# Patient Record
Sex: Female | Born: 1985 | Race: Black or African American | Hispanic: No | Marital: Single | Smoking: Never smoker
Health system: Southern US, Community
[De-identification: ages and names within clinical notes are randomized; demographics above are authoritative.]

## PROBLEM LIST (undated history)

## (undated) DIAGNOSIS — N6019 Diffuse cystic mastopathy of unspecified breast: Secondary | ICD-10-CM

## (undated) DIAGNOSIS — D249 Benign neoplasm of unspecified breast: Secondary | ICD-10-CM

## (undated) HISTORY — DX: Benign neoplasm of unspecified breast: D24.9

## (undated) HISTORY — DX: Diffuse cystic mastopathy of unspecified breast: N60.19

---

## 2010-05-17 ENCOUNTER — Encounter: Payer: Self-pay | Admitting: Family Medicine

## 2010-05-25 ENCOUNTER — Encounter: Payer: Self-pay | Admitting: Family Medicine

## 2010-06-28 ENCOUNTER — Ambulatory Visit: Payer: Self-pay | Admitting: Family Medicine

## 2011-07-06 ENCOUNTER — Emergency Department: Payer: Self-pay | Admitting: Emergency Medicine

## 2011-09-25 HISTORY — PX: BREAST BIOPSY: SHX20

## 2012-03-03 ENCOUNTER — Ambulatory Visit: Payer: Self-pay | Admitting: General Surgery

## 2012-03-03 LAB — PREGNANCY, URINE: Pregnancy Test, Urine: NEGATIVE m[IU]/mL

## 2012-03-04 LAB — PATHOLOGY REPORT

## 2012-04-25 LAB — HEMOGLOBIN A1C: Hgb A1c MFr Bld: 5.7 % (ref 4.0–6.0)

## 2013-03-30 ENCOUNTER — Ambulatory Visit: Payer: Self-pay | Admitting: General Surgery

## 2013-05-07 ENCOUNTER — Encounter: Payer: Self-pay | Admitting: *Deleted

## 2014-08-25 LAB — LIPID PANEL
Cholesterol: 197 mg/dL (ref 0–200)
HDL: 56 mg/dL (ref 35–70)
LDL Cholesterol: 125 mg/dL
Triglycerides: 82 mg/dL (ref 40–160)

## 2014-08-25 LAB — HM PAP SMEAR: HM PAP: NORMAL

## 2015-01-16 NOTE — Op Note (Signed)
PATIENT NAME:  Kristin Allison, Kristin Allison MR#:  [id number] DATE OF BIRTH:  08/02/1986  DATE OF PROCEDURE:  03/03/2012  PREOPERATIVE DIAGNOSIS: Fibroadenoma right breast.   POSTOPERATIVE DIAGNOSIS: Fibroadenoma right breast.   PROCEDURE: Excision right breast mass.   SURGEON: S.G. Jamal Collin, MD  ANESTHESIA: Local with a mixture of 0.5% Marcaine, 1% Xylocaine.   COMPLICATIONS: None.   ESTIMATED BLOOD LOSS: Minimal.   DRAINS: None.   DESCRIPTION OF PROCEDURE: Patient was placed in the supine position on the operating table. After adequate IV sedation and monitoring, the right breast was prepped and draped out as a sterile field. At the 7:00 position in the right breast there was a palpable 2 cm mass. This had been previously core biopsied showing fibroadenoma with some atypical features for which excision was recommended. The skin was infiltrated with local anesthetic and a curved incision was made overlying this area, deepened through to the glandular tissue and a somewhat circumscribed mildly lobulated 2 cm mass was noted. This was excised out along with some normal breast tissue surrounding. After bleeding controlled with cautery, the deeper tissues were closed with 2-0 Vicryl and the skin closed with subcuticular 4-0 Vicryl, covered with Dermabond. Procedure was well tolerated. The excised specimen was sent in formalin for pathologic evaluation. Patient subsequently returned to recovery room in stable condition.   ____________________________ S.Robinette Haines, MD sgs:cms D: 03/03/2012 16:56:33 ET T: 03/04/2012 09:17:21 ET  JOB#: [id number] cc: S.G. Jamal Collin, MD, <Dictator> [facility] Robinette Haines MD ELECTRONICALLY SIGNED 03/04/2012 17:22

## 2015-02-04 ENCOUNTER — Other Ambulatory Visit (HOSPITAL_COMMUNITY): Payer: Self-pay | Admitting: Family Medicine

## 2015-02-04 ENCOUNTER — Other Ambulatory Visit: Payer: Self-pay | Admitting: Family Medicine

## 2015-02-04 ENCOUNTER — Other Ambulatory Visit: Payer: Self-pay

## 2015-02-04 ENCOUNTER — Ambulatory Visit (HOSPITAL_COMMUNITY): Payer: BLUE CROSS/BLUE SHIELD | Attending: Internal Medicine

## 2015-02-04 DIAGNOSIS — I071 Rheumatic tricuspid insufficiency: Secondary | ICD-10-CM | POA: Diagnosis not present

## 2015-02-04 DIAGNOSIS — R011 Cardiac murmur, unspecified: Secondary | ICD-10-CM | POA: Diagnosis present

## 2015-02-07 ENCOUNTER — Encounter: Payer: Self-pay | Admitting: *Deleted

## 2015-02-07 ENCOUNTER — Encounter: Payer: BLUE CROSS/BLUE SHIELD | Attending: Family Medicine | Admitting: *Deleted

## 2015-02-07 DIAGNOSIS — Z713 Dietary counseling and surveillance: Secondary | ICD-10-CM | POA: Diagnosis not present

## 2015-02-07 DIAGNOSIS — E7800 Pure hypercholesterolemia, unspecified: Secondary | ICD-10-CM

## 2015-02-07 DIAGNOSIS — E663 Overweight: Secondary | ICD-10-CM | POA: Insufficient documentation

## 2015-02-07 NOTE — Progress Notes (Signed)
Medical Nutrition Therapy:  Appt start time: 1100 end time:  1200.   Assessment:  Primary concerns today: Kristin Allison is  Here for nutrition counseling pertaining to desire to lose weight.   The referral from her medical provider also indicated dyslipidemia.  She states she knows what to do, but needs a plan that is sustainable.  She wants to lose about 45 pounds.  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  She would like to weigh less than that.  She currently weighs  165 lb.  She states her weight has fluctuates between 141-170 lb.    Her heaviest weight: 171 lb Lowest weight: high 130s Most consistent weight: 150 Would like to weigh: 120-125 lb  Dad is lean, mom is heavy; one sister is lean, one is heavy. Kristin Allison thinks she is a blend of both parents.  She thinks she could lose weight.  She was no heavy as a child and started to gain weight in college.  She continued to gain as a young adult and wasn't exercising and eating healthy.  She has gained weight gradually since college. Kristin Allison lives alone and does her own grocery shopping and cooking.  She grills and bakes or broils more often.  She uses a Violet Baldy more lately.  She has fried in the past, but not in the past year.  She eats out in varied amounts depending on the month.  She might not eat out at all for a month, but then eat out almost daily for awhile.  When at home she eats in the living room while watching tv and thinks she is not a fast eater.  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 She admits to eating quickly, eating a socially unacceptable amount of food, feeling out of control, and feeling guilty afterwards. She reports "binge episodes" every 2-3 weeks.  Feels like she is an enmotional eater.  She thinks her relationship with food is hurtful.    Preferred Learning Style:   No preference indicated   Learning  Readiness:  Ready   MEDICATIONS: none   DIETARY INTAKE:  Usual eating pattern includes 3 meals and 3 snacks per day.  Everyday foods include protein, vegetables.  Avoided foods include rice all the time; bread (when she's doing "good") and any sugars, bananas, watermelon.    24-hr recall:  B ( AM): eggs with vegetables.  Coffee with almond milk .  Sometimes skips when she doesn't prepare or might eat out.   Snk ( AM): protein balls: powder, chocolate chips, peanut butter; pumpkin seeds with banana chips; celery with hummus  L ( PM): baked chicken with peas and mashed sweet potatoes Snk ( PM): protein balls: powder, chocolate chips, peanut butter; pumpkin seeds with banana chips; celery with hummus D ( PM): baked chicken with peas and mashed sweet potatoes Snk ( PM): protein balls: powder, chocolate chips, peanut butter; pumpkin seeds with banana chips; celery with hummus Beverages: coffee, green tea, water, dandelion root tea  Usual physical activity: varies: last week exercised 3 times (3 mile run and pushups), works with Physiological scientist. Normally works out 4-5 times/week  Estimated energy needs: 2200 calories     Nutritional Diagnosis:  NI-1.4 Inadequate energy intake As related to dieting and increased exercise.  As evidenced by calorie restriction of 1700 calories vs 2200 needed.    Intervention:  Nutrition  counseling provided.  Discussed metabolic effects of meal skipping and/or dieting: calorie deficit decreases metabolic rate and increases fat storage.  Discussed Keys study in calorie deprivation on mental health.  Explained that dieting is contrary to the body's biological needs and will not be sustainable especially for someone with an emotional dependence on food.  She does not quit meet the eligibility criteria for binge eating disorder as she binges every 2-3 weeks instead of weekly.  However, her eating is disordered and attempting to diet right not will only cause more  food anxiety.  She would like to work on improving her relationship with food in the short-term and then pursue weight loss in a more healthful way in the future.   Recommended 3 meals/day and to avoid meal skipping.  Recommended honoring hunger cues which typically meals eating every 3-5 hours, depending.  Recommended carbohydrate and protein at each eating occasion and gave examples of such foods.  Discussed relaxation/stress management for alternatives to eating when feeling emotional.   Teaching Method Utilized: Auditory    Barriers to learning/adherence to lifestyle change: none  Demonstrated degree of understanding via:  Teach Back   Monitoring/Evaluation:  Dietary intake, exercise, and body weight in 2 week(s) per patient request

## 2015-03-03 ENCOUNTER — Ambulatory Visit: Payer: BLUE CROSS/BLUE SHIELD | Admitting: *Deleted

## 2015-04-26 HISTORY — PX: BREAST REDUCTION SURGERY: SHX8

## 2015-06-16 ENCOUNTER — Ambulatory Visit (INDEPENDENT_AMBULATORY_CARE_PROVIDER_SITE_OTHER): Payer: BLUE CROSS/BLUE SHIELD | Admitting: Family Medicine

## 2015-06-16 ENCOUNTER — Encounter: Payer: Self-pay | Admitting: Family Medicine

## 2015-06-16 VITALS — BP 122/86 | HR 78 | Temp 98.3°F | Resp 18 | Ht 64.0 in | Wt 173.4 lb

## 2015-06-16 DIAGNOSIS — R739 Hyperglycemia, unspecified: Secondary | ICD-10-CM | POA: Insufficient documentation

## 2015-06-16 DIAGNOSIS — Z23 Encounter for immunization: Secondary | ICD-10-CM

## 2015-06-16 DIAGNOSIS — E663 Overweight: Secondary | ICD-10-CM | POA: Diagnosis not present

## 2015-06-16 DIAGNOSIS — Z114 Encounter for screening for human immunodeficiency virus [HIV]: Secondary | ICD-10-CM

## 2015-06-16 NOTE — Progress Notes (Signed)
Name: Kristin Allison   MRN: [id number]    DOB: [birth year]   Date:06/16/2015       Progress Note  Subjective  Chief Complaint  Chief Complaint  Patient presents with  . Follow-up    wants STD check wants to go to [employer] and it is required    HPI  Hyperglycemia: last glucose was 79, but previous hgbA1C was 5.7% in 2013.  She denies polyphagia, polyuria or polydipsia.   HIV screen: needs to have it done to apply to the [employer]. 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  Overweight: she has struggled with her weight this Summer, she states her father was diagnosed with ALS in 2015 and has progressed rapidly . She has been stress eating, but now getting serious about losing weight. She has been preparing shakes for breakfast, drinking water, cooking at home.    Patient Active Problem List   Diagnosis Date Noted  . Overweight (BMI 25.0-29.9) 06/16/2015  . Hyperglycemia 06/16/2015    Past Surgical History  Procedure Laterality Date  . Breast biopsy Left 2013    History reviewed. No pertinent family history.  Social History   Social History  . Marital Status: Single    Spouse Name: N/A  . Number of Children: N/A  . Years of Education: N/A   Occupational History  . Not on file.   Social History Main Topics  . Smoking status: Never Smoker   . Smokeless tobacco: Never Used  . Alcohol Use: 0.0 oz/week    0 Standard drinks or equivalent per week  . Drug Use: No  . Sexual Activity: Not Currently   Other Topics Concern  . Not on file   Social History Narrative    No current outpatient prescriptions on file.  Allergies  Allergen Reactions  . Strawberry Swelling    Eye swelling  . Latex Itching and Rash     ROS  Constitutional: Negative for fever, positive for  weight change.  Respiratory: Negative for cough and shortness of breath.   Cardiovascular: Negative for chest pain or palpitations.   Gastrointestinal: Negative for abdominal pain, no bowel changes.  Musculoskeletal: Negative for gait problem or joint swelling.  Skin: Negative for rash.  Neurological: Negative for dizziness or headache.  No other specific complaints in a complete review of systems (except as listed in HPI above).  Objective  Filed Vitals:   06/16/15 0819  BP: 122/86  Pulse: 78  Temp: 98.3 F (36.8 C)  TempSrc: Oral  Resp: 18  Height: 5\' 4"  (1.626 m)  Weight: 173 lb 6.4 oz (78.654 kg)  SpO2: 98%    Body mass index is 29.75 kg/(m^2).  Physical Exam   Constitutional: Patient appears well-developed and well-nourished. Overweight  No distress.  HEENT: head atraumatic, normocephalic, pupils equal and reactive to light,  neck supple, throat within normal limits Cardiovascular: Normal rate, regular rhythm and normal heart sounds.  No murmur heard. No BLE edema. Pulmonary/Chest: Effort normal and breath sounds normal. No respiratory distress. Abdominal: Soft.  There is no tenderness. Psychiatric: Patient has a normal mood and affect. behavior is normal. Judgment and thought content normal.   PHQ2/9: Depression screen [facility] 2/9 06/16/2015 02/07/2015  Decreased Interest 0 0  Down, Depressed, Hopeless 0 0  PHQ - 2 Score 0 0     Fall Risk: Fall Risk  06/16/2015 02/07/2015  Falls in the past year? Yes No  Number falls in past yr: 1 -  Injury with Fall? No -     Functional Status Survey: Is the patient deaf or have difficulty hearing?: No Does the patient have difficulty seeing, even when wearing glasses/contacts?: Yes (glasses) Does the patient have difficulty concentrating, remembering, or making decisions?: No Does the patient have difficulty walking or climbing stairs?: No Does the patient have difficulty dressing or bathing?: No Does the patient have difficulty doing errands alone such as visiting a doctor's office or shopping?: No    Assessment & Plan  1. Overweight (BMI  25.0-29.9) Discussed with the patient the risk posed by an increased BMI. Discussed importance of portion control, calorie counting and at least 150 minutes of physical activity weekly. Avoid sweet beverages and drink more water. Eat at least 6 servings of fruit and vegetables daily   2. Needs flu shot  - Flu Vaccine QUAD 36+ mos PF IM (Fluarix & Fluzone Quad PF) - refused  3. Encounter for screening for HIV  - HIV antibody  4. Hyperglycemia  - Hemoglobin A1c

## 2015-06-17 LAB — HEMOGLOBIN A1C
ESTIMATED AVERAGE GLUCOSE: 120 mg/dL
HEMOGLOBIN A1C: 5.8 % — AB (ref 4.8–5.6)

## 2015-06-17 LAB — HIV ANTIBODY (ROUTINE TESTING W REFLEX): HIV Screen 4th Generation wRfx: NONREACTIVE

## 2015-06-20 ENCOUNTER — Telehealth: Payer: Self-pay

## 2015-06-20 NOTE — Progress Notes (Signed)
Pt.notified

## 2015-06-29 NOTE — Telephone Encounter (Signed)
error 

## 2015-07-06 ENCOUNTER — Encounter: Payer: Self-pay | Admitting: Family Medicine

## 2015-07-07 ENCOUNTER — Other Ambulatory Visit: Payer: Self-pay | Admitting: Family Medicine

## 2015-07-07 DIAGNOSIS — R739 Hyperglycemia, unspecified: Secondary | ICD-10-CM

## 2015-08-02 ENCOUNTER — Encounter: Payer: Self-pay | Admitting: Dietician

## 2015-08-02 ENCOUNTER — Encounter: Payer: BLUE CROSS/BLUE SHIELD | Attending: Family Medicine | Admitting: Dietician

## 2015-08-02 VITALS — Ht 63.5 in | Wt 169.5 lb

## 2015-08-02 DIAGNOSIS — E669 Obesity, unspecified: Secondary | ICD-10-CM | POA: Diagnosis not present

## 2015-08-02 NOTE — Progress Notes (Signed)
Medical Nutrition Therapy: Visit start time:9:00  end time: 10:30 Assessment:  Diagnosis: obesity Past medical history: hyperglycemia Psychosocial issues/ stress concerns: Patient rates her stress as moderate and indicates "ok" as to how she is dealing with her stress. Her father passed away 2015-06-15. Preferred learning method:  . Auditory . Visual Current weight: 169.5 lbs  Height: 63.5 in Medications, supplements: none  Progress and evaluation:  Patient in for her initial nutrition assessment. 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 She was eating more high fat fast foods and had no physical activity.   She rates her motivation to make diet changes as a "4" on some days and "8" on others. She is still working through the grieving process but wants to begin making changes. She has started with some changes and reports she has been afraid of carbohydrates after she was told that her blood sugar was "borderline" high. She states that on some days she falls back into pattern of picking up high fat fast food choices but has definitely decreased meals "out" and is preparing more meals at home.  Physical activity: cardio-vascular 1-6 times per week for 30 minutes to 1 hour. Dietary Intake:  Usual eating pattern includes 3 meals and 1-2 snacks per day. Dining out frequency: 2-4 meals per week.  Breakfast: 7:30smoothie with blueberries, almond milk, spinach, flaxseed and 2 eggs or 3 scrambled eggs and hot tea or chicken biscuit/or.juice Lunch: 10:30 -leftovers from dinner, or McDonald's salad ([employer]), water Snack: almonds or pumpkin seeds Supper: 7:00pm- barbeque sandwich, fries, water or salmon, sweet potato/ broccoli or nuggets, fries, diet lemonade Snack: "Skinny Girl" popcorn Beverages: water, juice, hot tea   Nutrition Care Education: Weight control: Instructed on a meal plan based on 1500 calories  including carbohydrate counting and need to balance carbohydrate, protein and non-starchy vegetables with small amounts of added fat. Used food guide plate/ food models to help with showing how to balance. Gave and reviewed sample menus. Pre-Diabetes: Discussed how diet and exercise changes can help to lower riskt of type 2 diabetes.  Nutritional Diagnosis:  Beacon-3.3 Overweight/obesity As related to history of frequent intake of high fat foods.  As evidenced by diet history and rate of weight gain.. Intervention:  Establish consistent meal pattern of 3 meals and 2 snacks. Balance meals with protein, 2-3 servings of carbohydrate and "free vegetables". Estimate carbohydrate servings at meals and snacks. Include 2-3 servings of carbohydrate at meals and 1 at snacks. Spread 10 servings of carbohydrate over 3 meals and 2 snacks. Measure some portions of carbohydrate foods, especially starches. Read labels for carbohydrate grams remembering that 15 gms of carbohydrate equals one serving. When having a high fat food at a meal such as a fried food with a low fat side such as vegetables and fruit.   Education Materials given:  . Food lists/ Planning A Balanced Meal . Sample meal pattern/ menus . Goals/ instructions  Learner/ who was taught:  . Patient  Level of understanding: . Partial understanding; needs review/ practice Learning barriers: . None Willingness to learn/ readiness for change: . Eager, change in progress Monitoring and Evaluation:  Dietary intake, exercise, and body weight      follow up:09/01/15 at 9:00am

## 2015-08-02 NOTE — Patient Instructions (Signed)
Establish consistent meal pattern of 3 meals and 2 snacks. Balance meals with protein, 2-3 servings of carbohydrate and "free vegetables". Estimate carbohydrate servings at meals and snacks. Include 2-3 servings of carbohydrate at meals and 1 at snacks. Spread 10 servings of carbohydrate over 3 meals and 2 snacks. Measure some portions of carbohydrate foods, especially starches. Read labels for carbohydrate grams remembering that 15 gms of carbohydrate equals one serving. When having a high fat food at a meal such as a fried food with a low fat side such as vegetables and fruit.

## 2015-09-01 ENCOUNTER — Ambulatory Visit: Payer: BLUE CROSS/BLUE SHIELD | Admitting: Dietician

## 2015-09-23 ENCOUNTER — Encounter: Payer: Self-pay | Admitting: Dietician

## 2015-09-30 ENCOUNTER — Ambulatory Visit (INDEPENDENT_AMBULATORY_CARE_PROVIDER_SITE_OTHER): Payer: BLUE CROSS/BLUE SHIELD | Admitting: Family Medicine

## 2015-09-30 ENCOUNTER — Encounter: Payer: Self-pay | Admitting: Family Medicine

## 2015-09-30 VITALS — BP 134/86 | HR 81 | Temp 98.6°F | Resp 18 | Ht 63.0 in | Wt 174.2 lb

## 2015-09-30 DIAGNOSIS — J0100 Acute maxillary sinusitis, unspecified: Secondary | ICD-10-CM

## 2015-09-30 MED ORDER — AMOXICILLIN-POT CLAVULANATE 875-125 MG PO TABS: 1.0000 | ORAL_TABLET | Freq: Two times a day (BID) | ORAL | Status: DC

## 2015-09-30 NOTE — Patient Instructions (Signed)

## 2015-09-30 NOTE — Progress Notes (Signed)
Name: Kristin Allison   MRN: [id number]    DOB: 12-27-1985   Date:09/30/2015       Progress Note  Subjective  Chief Complaint  Chief Complaint  Patient presents with  . URI    HPI  Patient is here today with concerns regarding the following symptoms sore throat, congestion, post nasal drip, sneezing, non productive cough and achiness that started 3 days ago.  Associated with chills, sweats, fatigue and anorexia. Not associated with fever. Has tried the following home remedies: alka seltzer cold   Past Medical History  Diagnosis Date  . Benign neoplasm of breast     Left  . Diffuse cystic mastopathy     Social History  Substance Use Topics  . Smoking status: Never Smoker   . Smokeless tobacco: Never Used  . Alcohol Use: 0.0 oz/week    0 Standard drinks or equivalent per week    No current outpatient prescriptions on file.  No Known Allergies  ROS  Positive for fatigue, nasal congestion, sinus pressure, ear fullness, cough as mentioned in HPI, otherwise all systems reviewed and are negative.  Objective  Filed Vitals:   09/30/15 1131  BP: 134/86  Pulse: 81  Temp: 98.6 F (37 C)  TempSrc: Oral  Resp: 18  Height: 5\' 3"  (1.6 m)  Weight: 174 lb 3.2 oz (79.017 kg)  SpO2: 97%   Body mass index is 30.87 kg/(m^2).   Physical Exam  Constitutional: Patient appears well-developed and well-nourished. In no acute distress but does appear to be fatigued from acute illness. HEENT:  - Head: Normocephalic and atraumatic.  - Ears: RIGHT TM bulging with minimal clear exudate, LEFT TM bulging with minimal clear exudate.  - Nose: Nasal mucosa boggy and congested.  - Mouth/Throat: Oropharynx is moist with slight erythema of bilateral tonsils without hypertrophy or exudates. Post nasal drainage present.  - Eyes: Conjunctivae clear, EOM movements normal. PERRLA. No scleral icterus.  Neck: Normal range of motion. Neck supple. No JVD present. No thyromegaly present. No local  lymphadenopathy. Cardiovascular: Regular rate, regular rhythm with no murmurs heard.  Pulmonary/Chest: Effort normal and breath sounds clear in all lung fields.  Musculoskeletal: Normal range of motion bilateral UE and LE, no joint effusions. Skin: Skin is warm and dry. No rash noted. Psychiatric: Patient has a normal mood and affect. Behavior is normal in office today. Judgment and thought content normal in office today.   Assessment & Plan  1. Acute maxillary sinusitis, recurrence not specified Increased hydration, rest, nasal saline spray prn.  - amoxicillin-clavulanate (AUGMENTIN) 875-125 MG tablet; Take 1 tablet by mouth 2 (two) times daily.  Dispense: 20 tablet; Refill: 0

## 2015-12-27 ENCOUNTER — Ambulatory Visit: Payer: BLUE CROSS/BLUE SHIELD | Admitting: Family Medicine

## 2016-01-04 ENCOUNTER — Encounter: Payer: Self-pay | Admitting: Family Medicine

## 2016-01-04 ENCOUNTER — Ambulatory Visit
Admission: RE | Admit: 2016-01-04 | Discharge: 2016-01-04 | Disposition: A | Discharge status: Home / Self Care | Payer: BLUE CROSS/BLUE SHIELD | Source: Ambulatory Visit | Attending: Family Medicine | Admitting: Family Medicine

## 2016-01-04 ENCOUNTER — Ambulatory Visit (INDEPENDENT_AMBULATORY_CARE_PROVIDER_SITE_OTHER): Payer: BLUE CROSS/BLUE SHIELD | Admitting: Family Medicine

## 2016-01-04 VITALS — BP 132/73 | HR 77 | Temp 98.6°F | Resp 16 | Ht 63.0 in | Wt 175.1 lb

## 2016-01-04 DIAGNOSIS — R05 Cough: Secondary | ICD-10-CM | POA: Insufficient documentation

## 2016-01-04 DIAGNOSIS — J040 Acute laryngitis: Secondary | ICD-10-CM | POA: Diagnosis not present

## 2016-01-04 DIAGNOSIS — R058 Other specified cough: Secondary | ICD-10-CM

## 2016-01-04 DIAGNOSIS — I517 Cardiomegaly: Secondary | ICD-10-CM | POA: Insufficient documentation

## 2016-01-04 DIAGNOSIS — J060 Acute laryngopharyngitis: Secondary | ICD-10-CM | POA: Insufficient documentation

## 2016-01-04 LAB — POCT RAPID STREP A (OFFICE): Rapid Strep A Screen: NEGATIVE

## 2016-01-04 NOTE — Progress Notes (Signed)
Name: Kristin Allison   MRN: [id number]    DOB: [birth year]   Date:01/04/2016       Progress Note  Subjective  Chief Complaint  Chief Complaint  Patient presents with  . Acute Visit  . Sore Throat    Hoarse    Sore Throat  This is a new problem. The current episode started in the past 7 days (2 days ago). There has been no fever. Associated symptoms include coughing. Pertinent negatives include no ear discharge, ear pain, headaches, neck pain, shortness of breath or swollen glands. She has had no exposure to strep.     Past Medical History  Diagnosis Date  . Benign neoplasm of breast     Left  . Diffuse cystic mastopathy     Past Surgical History  Procedure Laterality Date  . Breast biopsy Left 2013  . Breast reduction surgery  04/26/2015    History reviewed. No pertinent family history.  Social History   Social History  . Marital Status: Single    Spouse Name: N/A  . Number of Children: N/A  . Years of Education: N/A   Occupational History  . Not on file.   Social History Main Topics  . Smoking status: Never Smoker   . Smokeless tobacco: Never Used  . Alcohol Use: 0.0 oz/week    0 Standard drinks or equivalent per week  . Drug Use: No  . Sexual Activity: Not Currently   Other Topics Concern  . Not on file   Social History Narrative     Current outpatient prescriptions:  .  amoxicillin-clavulanate (AUGMENTIN) 875-125 MG tablet, Take 1 tablet by mouth 2 (two) times daily., Disp: 20 tablet, Rfl: 0  No Known Allergies   Review of Systems  Constitutional: Negative for fever and chills.  HENT: Positive for sore throat. Negative for ear discharge and ear pain.   Respiratory: Positive for cough and sputum production. Negative for shortness of breath and wheezing.   Musculoskeletal: Negative for neck pain.  Neurological: Negative for headaches.    Objective  Filed Vitals:   01/04/16 1000  BP: 132/73  Pulse: 77  Temp: 98.6 F (37 C)  TempSrc:  Oral  Resp: 16  Height: 5\' 3"  (1.6 m)  Weight: 175 lb 1.6 oz (79.425 kg)  SpO2: 97%    Physical Exam  Constitutional: She is well-developed, well-nourished, and in no distress.  HENT:  Nose: Right sinus exhibits no maxillary sinus tenderness. Left sinus exhibits no maxillary sinus tenderness.  Mouth/Throat: Mucous membranes are normal. Posterior oropharyngeal erythema present. No oropharyngeal exudate.  Neck: Neck supple.  Cardiovascular: Normal rate and regular rhythm.   Pulmonary/Chest: Breath sounds normal.  Faint expiratory crackles at the left lung base.  Nursing note and vitals reviewed.      Assessment & Plan  1. Acute viral laryngitis Rapid strep test is negative. Likely viral laryngitis, conservative measures recommended. - POCT rapid strep A  2. Productive cough Rule out pneumonia, especially in setting of crackles in left lung base and productive cough. - DG Chest 2 View; Future     Latrisha Coiro [facility] 01/04/2016 10:08 AM

## 2016-06-09 IMAGING — CR DG CHEST 2V
1 series · 2 of 2 positions shown · non-contrast
Comparison: No prior.

CLINICAL DATA: Productive cough.

EXAM:
CHEST  2 VIEW

[Series 1: dg chest 2 view · 0.14mm/px · 2 of 2 slices shown]
[im 1/2]
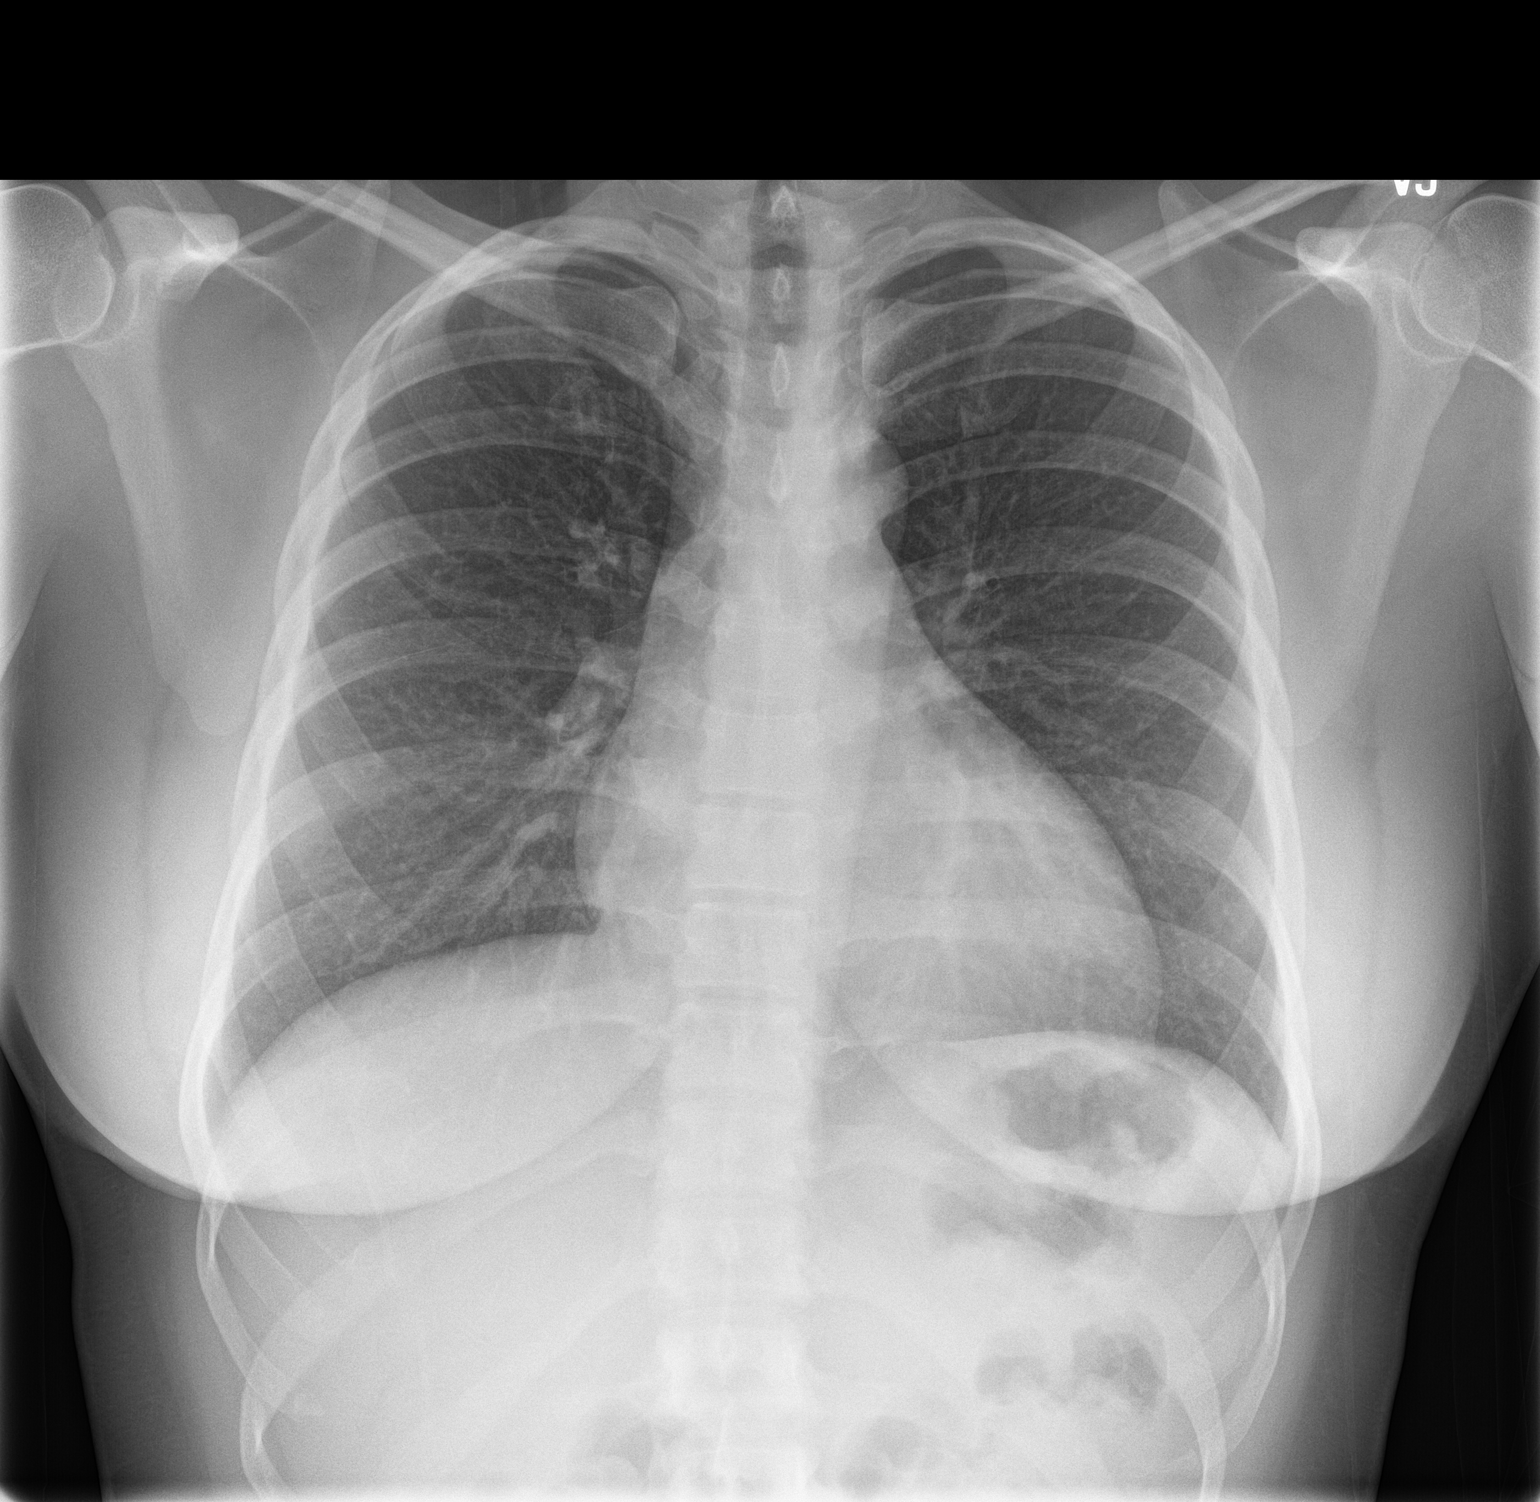
[im 2/2]
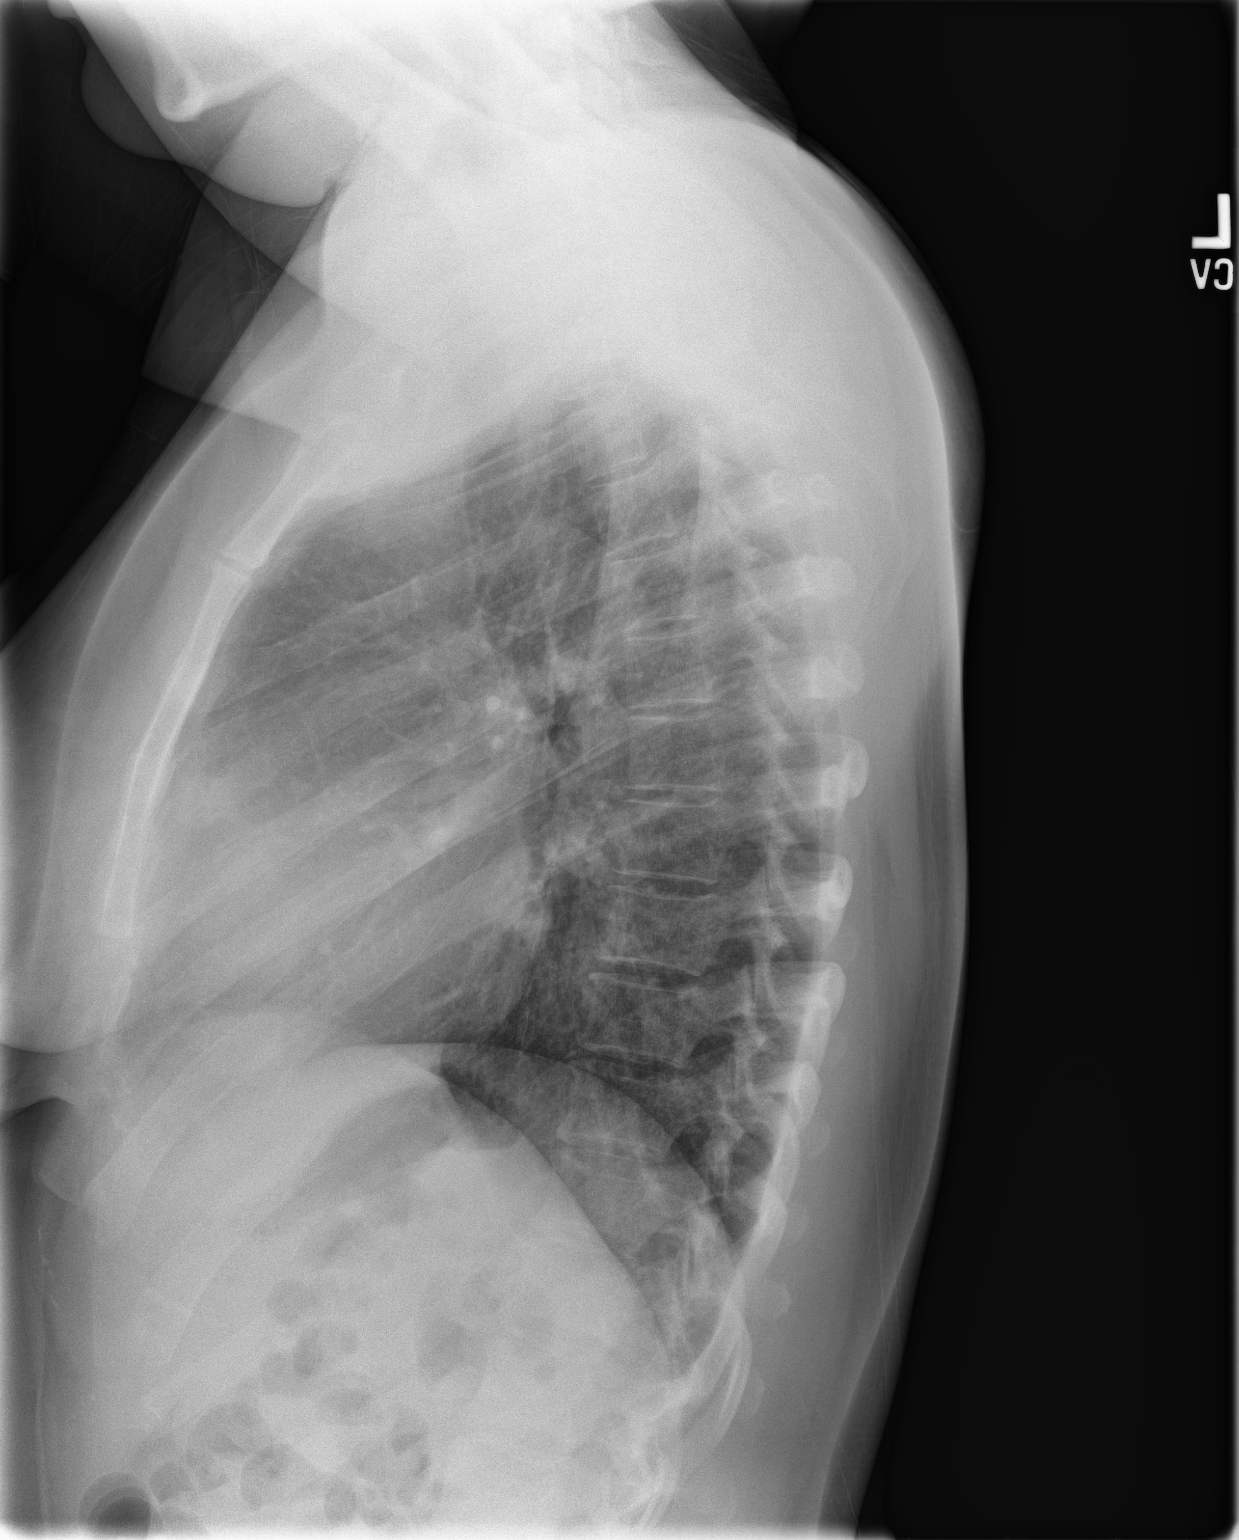

[2 of 2 positions shown; findings below may reference images not displayed]

FINDINGS: Mediastinum hilar structures normal. Lungs are clear. Cardiomegaly
with normal pulmonary vascularity. No pleural effusion or
pneumothorax. No acute bony abnormality .
IMPRESSION: Cardiomegaly with normal pulmonary vascularity. No acute pulmonary
disease .

## 2016-08-13 DIAGNOSIS — Z01419 Encounter for gynecological examination (general) (routine) without abnormal findings: Secondary | ICD-10-CM | POA: Diagnosis not present

## 2016-08-27 ENCOUNTER — Encounter: Payer: Self-pay | Admitting: Family Medicine

## 2016-08-27 ENCOUNTER — Ambulatory Visit (INDEPENDENT_AMBULATORY_CARE_PROVIDER_SITE_OTHER): Payer: BLUE CROSS/BLUE SHIELD | Admitting: Family Medicine

## 2016-08-27 VITALS — BP 124/74 | HR 74 | Temp 98.4°F | Resp 16 | Ht 63.0 in | Wt 179.3 lb

## 2016-08-27 DIAGNOSIS — Z23 Encounter for immunization: Secondary | ICD-10-CM | POA: Diagnosis not present

## 2016-08-27 DIAGNOSIS — H938X2 Other specified disorders of left ear: Secondary | ICD-10-CM | POA: Diagnosis not present

## 2016-08-27 DIAGNOSIS — H6123 Impacted cerumen, bilateral: Secondary | ICD-10-CM

## 2016-08-27 MED ORDER — FLUTICASONE PROPIONATE 50 MCG/ACT NA SUSP: 2.0000 | Freq: Every day | NASAL | 0 refills | Status: DC

## 2016-08-27 NOTE — Progress Notes (Signed)
Name: Kristin Allison   MRN: [id number]    DOB: 1986-06-28   Date:08/27/2016       Progress Note  Subjective  Chief Complaint  Chief Complaint  Patient presents with  . Ear Fullness    pt stated that her left ear cloggs every time she sleeps on that side and that she can not hear out of that ear when she does sleep on that side  . Flu Vaccine    pt declined    HPI  Ear fullness: she states that over the past few weeks she has noticed left ear fullness and difficulty hearing in the mornings, and in the evenings when laying on left lateral decubitus. No fever, chills, headaches, no jaw pain, no sinus pressure or post-nasal drainage. She denies an URI preceding the symptoms.    Patient Active Problem List   Diagnosis Date Noted  . Overweight (BMI 25.0-29.9) 06/16/2015  . Hyperglycemia 06/16/2015    Past Surgical History:  Procedure Laterality Date  . BREAST BIOPSY Left 2013  . BREAST REDUCTION SURGERY  04/26/2015    No family history on file.  Social History   Social History  . Marital status: Single    Spouse name: N/A  . Number of children: N/A  . Years of education: N/A   Occupational History  . Not on file.   Social History Main Topics  . Smoking status: Never Smoker  . Smokeless tobacco: Never Used  . Alcohol use 0.0 oz/week  . Drug use: No  . Sexual activity: Not Currently   Other Topics Concern  . Not on file   Social History Narrative  . No narrative on file     Current Outpatient Prescriptions:  .  fluticasone (FLONASE) 50 MCG/ACT nasal spray, Place 2 sprays into both nostrils daily., Disp: 16 g, Rfl: 0  No Known Allergies   ROS  Ten systems reviewed and is negative except as mentioned in HPI   Objective  Vitals:   08/27/16 0818  BP: 124/74  Pulse: 74  Resp: 16  Temp: 98.4 F (36.9 C)  TempSrc: Oral  SpO2: 96%  Weight: 179 lb 5 oz (81.3 kg)  Height: 5\' 3"  (1.6 m)    Body mass index is 31.76 kg/m.  Physical  Exam  Constitutional: Patient appears well-developed and obese .No distress.  HEENT: head atraumatic, normocephalic, pupils equal and reactive to light, ears cerumen in both ear canals, left more than right,  neck supple, throat within normal limits Cardiovascular: Normal rate, regular rhythm and normal heart sounds.  No murmur heard. No BLE edema. Pulmonary/Chest: Effort normal and breath sounds normal. No respiratory distress. Abdominal: Soft.  There is no tenderness. Psychiatric: Patient has a normal mood and affect. behavior is normal. Judgment and thought content normal.  PHQ2/9: Depression screen [facility] 2/9 01/04/2016 09/30/2015 08/02/2015 06/16/2015 02/07/2015  Decreased Interest 0 0 0 0 0  Down, Depressed, Hopeless 0 0 1 0 0  PHQ - 2 Score 0 0 1 0 0     Fall Risk: Fall Risk  01/04/2016 09/30/2015 08/02/2015 06/16/2015 02/07/2015  Falls in the past year? No No No Yes No  Number falls in past yr: - - - 1 -  Injury with Fall? - - - No -     Hearing Screening   125Hz  250Hz  500Hz  1000Hz  2000Hz  3000Hz  4000Hz  6000Hz  8000Hz   Right ear:   Pass Pass Pass  Pass    Left ear:   Pass Pass Pass  Pass  Functional Status Survey: Is the patient deaf or have difficulty hearing?: No Does the patient have difficulty seeing, even when wearing glasses/contacts?: No Does the patient have difficulty concentrating, remembering, or making decisions?: No Does the patient have difficulty walking or climbing stairs?: No Does the patient have difficulty dressing or bathing?: No Does the patient have difficulty doing errands alone such as visiting a doctor's office or shopping?: No    Assessment & Plan  1. Ear fullness, left  Likely eustachian tube dysfunction plus wax, we will lavage her ear and start nasal spray, but if symptoms persists we will refer her to ENT - fluticasone (FLONASE) 50 MCG/ACT nasal spray; Place 2 sprays into both nostrils daily.  Dispense: 16 g; Refill: 0  2. Bilateral impacted  cerumen  - Ear Lavage Verbal consent given Possible side effects discussed with patient Ears were  lavaged with warm water and peroxide  Patient tolerated procedure well No complications   3. Needs flu shot  refused

## 2016-11-21 ENCOUNTER — Encounter: Payer: BLUE CROSS/BLUE SHIELD | Admitting: Family Medicine

## 2017-07-05 ENCOUNTER — Encounter: Payer: Self-pay | Admitting: Family Medicine

## 2017-07-05 ENCOUNTER — Ambulatory Visit (INDEPENDENT_AMBULATORY_CARE_PROVIDER_SITE_OTHER): Payer: BLUE CROSS/BLUE SHIELD | Admitting: Family Medicine

## 2017-07-05 VITALS — BP 118/68 | HR 64 | Temp 98.1°F | Resp 16 | Ht 64.5 in | Wt 165.3 lb

## 2017-07-05 DIAGNOSIS — Z01419 Encounter for gynecological examination (general) (routine) without abnormal findings: Secondary | ICD-10-CM | POA: Diagnosis not present

## 2017-07-05 DIAGNOSIS — E559 Vitamin D deficiency, unspecified: Secondary | ICD-10-CM | POA: Diagnosis not present

## 2017-07-05 DIAGNOSIS — Z124 Encounter for screening for malignant neoplasm of cervix: Secondary | ICD-10-CM | POA: Diagnosis not present

## 2017-07-05 DIAGNOSIS — N6019 Diffuse cystic mastopathy of unspecified breast: Secondary | ICD-10-CM | POA: Insufficient documentation

## 2017-07-05 DIAGNOSIS — Z23 Encounter for immunization: Secondary | ICD-10-CM

## 2017-07-05 DIAGNOSIS — Z113 Encounter for screening for infections with a predominantly sexual mode of transmission: Secondary | ICD-10-CM | POA: Diagnosis not present

## 2017-07-05 DIAGNOSIS — L80 Vitiligo: Secondary | ICD-10-CM | POA: Insufficient documentation

## 2017-07-05 DIAGNOSIS — R7303 Prediabetes: Secondary | ICD-10-CM

## 2017-07-05 MED ORDER — METFORMIN HCL ER 750 MG PO TB24: 750.0000 mg | ORAL_TABLET | Freq: Every day | ORAL | 2 refills | Status: AC

## 2017-07-05 NOTE — Patient Instructions (Signed)
Preventive Care 18-39 Years, Female Preventive care refers to lifestyle choices and visits with your health care provider that can promote health and wellness. What does preventive care include?  A yearly physical exam. This is also called an annual well check.  Dental exams once or twice a year.  Routine eye exams. Ask your health care provider how often you should have your eyes checked.  Personal lifestyle choices, including: ? Daily care of your teeth and gums. ? Regular physical activity. ? Eating a healthy diet. ? Avoiding tobacco and drug use. ? Limiting alcohol use. ? Practicing safe sex. ? Taking vitamin and mineral supplements as recommended by your health care provider. What happens during an annual well check? The services and screenings done by your health care provider during your annual well check will depend on your age, overall health, lifestyle risk factors, and family history of disease. Counseling Your health care provider may ask you questions about your:  Alcohol use.  Tobacco use.  Drug use.  Emotional well-being.  Home and relationship well-being.  Sexual activity.  Eating habits.  Work and work Statistician.  Method of birth control.  Menstrual cycle.  Pregnancy history.  Screening You may have the following tests or measurements:  Height, weight, and BMI.  Diabetes screening. This is done by checking your blood sugar (glucose) after you have not eaten for a while (fasting).  Blood pressure.  Lipid and cholesterol levels. These may be checked every 5 years starting at age 66.  Skin check.  Hepatitis C blood test.  Hepatitis B blood test.  Sexually transmitted disease (STD) testing.  BRCA-related cancer screening. This may be done if you have a family history of breast, ovarian, tubal, or peritoneal cancers.  Pelvic exam and Pap test. This may be done every 3 years starting at age 40. Starting at age 59, this may be done every 5  years if you have a Pap test in combination with an HPV test.  Discuss your test results, treatment options, and if necessary, the need for more tests with your health care provider. Vaccines Your health care provider may recommend certain vaccines, such as:  Influenza vaccine. This is recommended every year.  Tetanus, diphtheria, and acellular pertussis (Tdap, Td) vaccine. You may need a Td booster every 10 years.  Varicella vaccine. You may need this if you have not been vaccinated.  HPV vaccine. If you are 69 or younger, you may need three doses over 6 months.  Measles, mumps, and rubella (MMR) vaccine. You may need at least one dose of MMR. You may also need a second dose.  Pneumococcal 13-valent conjugate (PCV13) vaccine. You may need this if you have certain conditions and were not previously vaccinated.  Pneumococcal polysaccharide (PPSV23) vaccine. You may need one or two doses if you smoke cigarettes or if you have certain conditions.  Meningococcal vaccine. One dose is recommended if you are age 27-21 years and a first-year college student living in a residence hall, or if you have one of several medical conditions. You may also need additional booster doses.  Hepatitis A vaccine. You may need this if you have certain conditions or if you travel or work in places where you may be exposed to hepatitis A.  Hepatitis B vaccine. You may need this if you have certain conditions or if you travel or work in places where you may be exposed to hepatitis B.  Haemophilus influenzae type b (Hib) vaccine. You may need this if  you have certain risk factors.  Talk to your health care provider about which screenings and vaccines you need and how often you need them. This information is not intended to replace advice given to you by your health care provider. Make sure you discuss any questions you have with your health care provider. Document Released: 11/06/2001 Document Revised: 05/30/2016  Document Reviewed: 07/12/2015 Elsevier Interactive Patient Education  2017 [employer].

## 2017-07-05 NOTE — Progress Notes (Signed)
Name: Kristin Allison   MRN: [id number]    DOB: [birth year]   Date:07/05/2017       Progress Note  Subjective  Chief Complaint  Chief Complaint  Patient presents with  . Gynecologic Exam    Would like a Pap and HIV Test done.    HPI    Patient presents for annual CPE , going to join Science writer school  Pre-diabetes/overweight: she has changed diet, exercise frequency and needs to be at a BMI of 25 to join the [employer]. Deadline is Nov 2018. Discussed options, and she is willing to try Metformin, currently prefers not trying GLP-1 agonist. Last hgbA1C was 5.8. We will recheck today. She denies polyphagia, polyuria or polydipsia   Diet: eating a lot of fruit, vegetables, nuts and lean meat  Exercise:   Current Exercise Habits: Structured exercise class, Type of exercise: Other - see comments (running, exercise class), Time (Minutes): 60, Frequency (Times/Week): 3, Weekly Exercise (Minutes/Week): 180, Intensity: Intense Exercise limited by: None identified   USPSTF grade A and B recommendations  Depression:  Depression screen [facility] 2/9 07/05/2017 01/04/2016 09/30/2015 08/02/2015 06/16/2015  Decreased Interest 0 0 0 0 0  Down, Depressed, Hopeless 0 0 0 1 0  PHQ - 2 Score 0 0 0 1 0   Hypertension: BP Readings from Last 3 Encounters:  07/05/17 118/68  08/27/16 124/74  01/04/16 132/73   Obesity: Wt Readings from Last 3 Encounters:  07/05/17 165 lb 4.8 oz (75 kg)  08/27/16 179 lb 5 oz (81.3 kg)  01/04/16 175 lb 1.6 oz (79.4 kg)   BMI Readings from Last 3 Encounters:  07/05/17 27.94 kg/m  08/27/16 31.76 kg/m  01/04/16 31.02 kg/m    Alcohol: a couple glasses every other week Tobacco use: never HIV, hep B, hep C: she needs HIV for [employer] school  STD testing and prevention (chl/gon/syphilis): not sexually active, never been, but needs HIV for [employer]  Intimate partner violence: N/A Sexual History/Pain during Intercourse: never sexually active Menstrual History/LMP:  regular, LMP started 07/02/2017 Incontinence Symptoms: no  Advanced Care Planning: A voluntary discussion about advance care planning including the explanation and discussion of advance directives.  Discussed health care proxy and Living will, and the patient was able to identify a health care proxy as sister [name])  Patient does not have a living will at present time. If patient does have living will, I have requested they bring this to the clinic to be scanned in to their chart.   Cervical cancer screening: due today    Lipids:  Lab Results  Component Value Date   CHOL 197 08/25/2014   Lab Results  Component Value Date   HDL 56 08/25/2014   Lab Results  Component Value Date   LDLCALC 125 08/25/2014   Lab Results  Component Value Date   TRIG 82 08/25/2014   No results found for: CHOLHDL No results found for: LDLDIRECT   Patient Active Problem List   Diagnosis Date Noted  . Vitamin D deficiency 07/05/2017  . Vitiligo 07/05/2017  . Fibrocystic breast 07/05/2017  . Overweight (BMI 25.0-29.9) 06/16/2015  . Hyperglycemia 06/16/2015    Past Surgical History:  Procedure Laterality Date  . BREAST BIOPSY Left 2013  . BREAST REDUCTION SURGERY  04/26/2015    Family History  Problem Relation Age of Onset  . Metabolic syndrome Mother   . ALS Father     Social History   Social History  . Marital status: Single  Spouse name: N/A  . Number of children: N/A  . Years of education: N/A   Occupational History  . Not on file.   Social History Main Topics  . Smoking status: Never Smoker  . Smokeless tobacco: Never Used  . Alcohol use 0.6 oz/week    1 Glasses of wine per week  . Drug use: No  . Sexual activity: No   Other Topics Concern  . 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  No Known Allergies   ROS   Constitutional: Negative for fever , positive for  weight change.  Respiratory: Negative for cough and shortness of breath.   Cardiovascular: Negative for chest pain or palpitations.  Gastrointestinal: Negative for abdominal pain, no bowel changes.  Musculoskeletal: Negative for gait problem or joint swelling.  Skin: Negative for rash.  Neurological: Negative for dizziness or headache.  No other specific complaints in a complete review of systems (except as listed in HPI above).   Objective  Vitals:   07/05/17 1433  BP: 118/68  Pulse: 64  Resp: 16  Temp: 98.1 F (36.7 C)  TempSrc: Oral  SpO2: 98%  Weight: 165 lb 4.8 oz (75 kg)  Height: 5' 4.5" (1.638 m)    Body mass index is 27.94 kg/m.  Physical Exam  Constitutional: Patient appears well-developed and well-nourished. No distress.  HENT: Head: Normocephalic and atraumatic. Ears: B TMs ok, no erythema or effusion; Nose: Nose normal. Mouth/Throat: Oropharynx is clear and moist. No oropharyngeal exudate.  Eyes: Conjunctivae and EOM are normal. Pupils are equal, round, and reactive to light. No scleral icterus.  Neck: Normal range of motion. Neck supple. No JVD present. No thyromegaly present.  Cardiovascular: Normal rate, regular rhythm and normal heart sounds.  No murmur heard. No BLE edema. Pulmonary/Chest: Effort normal and breath sounds normal. No respiratory distress. Abdominal: Soft. Bowel sounds are normal, no distension. There is no tenderness. no masses Breast: no lumps or masses, no nipple discharge or rashes, scar tissue from previous breast reduction FEMALE GENITALIA:  External genitalia normal External urethra normal Vaginal vault normal without discharge or lesions Cervix normal without discharge or lesions Bimanual exam normal without masses RECTAL: not done Musculoskeletal: Normal range of motion, no joint effusions. No gross  deformities Neurological: he is alert and oriented to person, place, and time. No cranial nerve deficit. Coordination, balance, strength, speech and gait are normal.  Skin: Skin is warm and dry. Vitiligo on left leg, left vulva and back . No erythema.  Psychiatric: Patient has a normal mood and affect. behavior is normal. Judgment and thought content normal.    PHQ2/9: Depression screen [facility] 2/9 07/05/2017 01/04/2016 09/30/2015 08/02/2015 06/16/2015  Decreased Interest 0 0 0 0 0  Down, Depressed, Hopeless 0 0 0 1 0  PHQ - 2 Score 0 0 0 1 0     Fall Risk: Fall Risk  07/05/2017 01/04/2016 09/30/2015 08/02/2015 06/16/2015  Falls in the past year? No No No No Yes  Number falls in past yr: - - - - 1  Injury with Fall? - - - - No     Functional Status Survey: Is the patient deaf or have difficulty hearing?: No Does the patient have difficulty seeing, even when wearing glasses/contacts?: No Does the  patient have difficulty concentrating, remembering, or making decisions?: No Does the patient have difficulty walking or climbing stairs?: No Does the patient have difficulty doing errands alone such as visiting a doctor's office or shopping?: No   Assessment & Plan  1. Well woman exam  - COMPLETE METABOLIC PANEL WITH GFR - Lipid panel - Hemoglobin A1c - CBC - VITAMIN D 25 Hydroxy (Vit-D Deficiency, Fractures)  2. Need for immunization against influenza  - Flu Vaccine QUAD 6+ mos PF IM (Fluarix Quad PF)  3. Need for hepatitis A immunization  refused  4. Need for vaccination for meningococcus  - Meningococcal MCV4O(Menveo)  5. Cervical cancer screening  - Pap IG and HPV (high risk) DNA detection  6. Routine screening for STI (sexually transmitted infection)  - HIV antibody  7. Vitamin D deficiency    8. Pre-diabetes  - metFORMIN (GLUCOPHAGE XR) 750 MG 24 hr tablet; Take 1 tablet (750 mg total) by mouth daily with breakfast.  Dispense: 30 tablet; Refill: 2 - Insulin,  random   9. Need for hepatitis B vaccination  -hepatitis B today    -USPSTF grade A and B recommendations reviewed with patient; age-appropriate recommendations, preventive care, screening tests, etc discussed and encouraged; healthy living encouraged; see AVS for patient education given to patient -Discussed importance of 150 minutes of physical activity weekly, eat two servings of fish weekly, eat one serving of tree nuts ( cashews, pistachios, pecans, almonds.Marland Kitchen) every other day, eat 6 servings of fruit/vegetables daily and drink plenty of water and avoid sweet beverages.  -Red flags and when to present for emergency care or RTC including fever >101.36F, chest pain, shortness of breath, new/worsening/un-resolving symptoms, reviewed with patient at time of visit. Follow up and care instructions discussed and provided in AVS.

## 2017-07-10 LAB — PAP IG AND HPV HIGH-RISK: HPV DNA HIGH RISK: NOT DETECTED

## 2017-07-26 ENCOUNTER — Telehealth: Payer: Self-pay | Admitting: Family Medicine

## 2017-07-26 DIAGNOSIS — R7303 Prediabetes: Secondary | ICD-10-CM | POA: Diagnosis not present

## 2017-07-26 DIAGNOSIS — Z01419 Encounter for gynecological examination (general) (routine) without abnormal findings: Secondary | ICD-10-CM | POA: Diagnosis not present

## 2017-07-26 DIAGNOSIS — Z124 Encounter for screening for malignant neoplasm of cervix: Secondary | ICD-10-CM | POA: Diagnosis not present

## 2017-07-26 DIAGNOSIS — Z113 Encounter for screening for infections with a predominantly sexual mode of transmission: Secondary | ICD-10-CM | POA: Diagnosis not present

## 2017-07-26 NOTE — Telephone Encounter (Signed)
Patient has a active MyChart account so explain how results are online. Also informed patient results came back normal and to have blood work drawn.

## 2017-07-26 NOTE — Telephone Encounter (Signed)
Pt would like pap results and once those are given to her she would like a copy of those results to be mailed to her along with her labs that she did today. Please call with those results.

## 2017-07-27 LAB — CBC
HCT: 41 % (ref 35.0–45.0)
Hemoglobin: 13.9 g/dL (ref 11.7–15.5)
MCH: 30.5 pg (ref 27.0–33.0)
MCHC: 33.9 g/dL (ref 32.0–36.0)
MCV: 90.1 fL (ref 80.0–100.0)
MPV: 9.9 fL (ref 7.5–12.5)
PLATELETS: 372 10*3/uL (ref 140–400)
RBC: 4.55 10*6/uL (ref 3.80–5.10)
RDW: 12.9 % (ref 11.0–15.0)
WBC: 6.4 10*3/uL (ref 3.8–10.8)

## 2017-07-27 LAB — COMPLETE METABOLIC PANEL WITH GFR
AG RATIO: 1.8 (calc) (ref 1.0–2.5)
ALBUMIN MSPROF: 4.4 g/dL (ref 3.6–5.1)
ALKALINE PHOSPHATASE (APISO): 58 U/L (ref 33–115)
ALT: 16 U/L (ref 6–29)
AST: 18 U/L (ref 10–30)
BILIRUBIN TOTAL: 0.7 mg/dL (ref 0.2–1.2)
BUN: 7 mg/dL (ref 7–25)
CHLORIDE: 106 mmol/L (ref 98–110)
CO2: 24 mmol/L (ref 20–32)
Calcium: 9.2 mg/dL (ref 8.6–10.2)
Creat: 0.74 mg/dL (ref 0.50–1.10)
GFR, Est African American: 124 mL/min/{1.73_m2} (ref >=60)
GFR, Est Non African American: 107 mL/min/{1.73_m2} (ref >=60)
GLUCOSE: 83 mg/dL (ref 65–99)
Globulin: 2.5 g/dL (calc) (ref 1.9–3.7)
POTASSIUM: 4.1 mmol/L (ref 3.5–5.3)
SODIUM: 139 mmol/L (ref 135–146)
Total Protein: 6.9 g/dL (ref 6.1–8.1)

## 2017-07-27 LAB — LIPID PANEL
CHOL/HDL RATIO: 2.8 (calc) (ref <5.0)
CHOLESTEROL: 189 mg/dL (ref <200)
HDL: 68 mg/dL (ref >50)
LDL CHOLESTEROL (CALC): 108 mg/dL — AB
Non-HDL Cholesterol (Calc): 121 mg/dL (calc) (ref <130)
Triglycerides: 45 mg/dL (ref <150)

## 2017-07-27 LAB — VITAMIN D 25 HYDROXY (VIT D DEFICIENCY, FRACTURES): Vit D, 25-Hydroxy: 24 ng/mL — ABNORMAL LOW (ref 30–100)

## 2017-07-27 LAB — HEMOGLOBIN A1C
EAG (MMOL/L): 5.7 (calc)
Hgb A1c MFr Bld: 5.2 % of total Hgb (ref <5.7)
Mean Plasma Glucose: 103 (calc)

## 2017-07-27 LAB — HIV ANTIBODY (ROUTINE TESTING W REFLEX): HIV 1&2 Ab, 4th Generation: NONREACTIVE

## 2017-07-29 LAB — INSULIN, RANDOM: INSULIN: 4.5 u[IU]/mL (ref 2.0–19.6)

## 2017-08-29 ENCOUNTER — Encounter: Payer: Self-pay | Admitting: Family Medicine

## 2017-08-29 ENCOUNTER — Ambulatory Visit (INDEPENDENT_AMBULATORY_CARE_PROVIDER_SITE_OTHER): Payer: BLUE CROSS/BLUE SHIELD | Admitting: Family Medicine

## 2017-08-29 VITALS — BP 122/84 | Temp 98.3°F | Ht 64.5 in | Wt 160.6 lb

## 2017-08-29 DIAGNOSIS — K5900 Constipation, unspecified: Secondary | ICD-10-CM

## 2017-08-29 LAB — HEMOCCULT GUIAC POC 1CARD (OFFICE): Fecal Occult Blood, POC: NEGATIVE

## 2017-08-29 MED ORDER — POLYETHYLENE GLYCOL 3350 17 GM/SCOOP PO POWD: 17.0000 g | Freq: Two times a day (BID) | ORAL | 2 refills | Status: AC

## 2017-08-29 MED ORDER — GLYCERIN (ADULT) 2 G RE SUPP: 1.0000 | RECTAL | 0 refills | Status: AC | PRN

## 2017-08-29 NOTE — Patient Instructions (Addendum)
May use Preparation H or Tucks pads if having pain.  Drink plenty of fluids, continue to exercise as tolerated.  Constipation, Adult Constipation is when a person:  Poops (has a bowel movement) fewer times in a week than normal.  Has a hard time pooping.  Has poop that is dry, hard, or bigger than normal.  Follow these instructions at home: Eating and drinking   Eat foods that have a lot of fiber, such as: ? Fresh fruits and vegetables. ? Whole grains. ? Beans.  Eat less of foods that are high in fat, low in fiber, or overly processed, such as: ? [place] fries. ? Hamburgers. ? Cookies. ? Candy. ? Soda.  Drink enough fluid to keep your pee (urine) clear or pale yellow. General instructions  Exercise regularly or as told by your doctor.  Go to the restroom when you feel like you need to poop. Do not hold it in.  Take over-the-counter and prescription medicines only as told by your doctor. These include any fiber supplements.  Do pelvic floor retraining exercises, such as: ? Doing deep breathing while relaxing your lower belly (abdomen). ? Relaxing your pelvic floor while pooping.  Watch your condition for any changes.  Keep all follow-up visits as told by your doctor. This is important. Contact a doctor if:  You have pain that gets worse.  You have a fever.  You have not pooped for 4 days.  You throw up (vomit).  You are not hungry.  You lose weight.  You are bleeding from the anus.  You have thin, pencil-like poop (stool). Get help right away if:  You have a fever, and your symptoms suddenly get worse.  You leak poop or have blood in your poop.  Your belly feels hard or bigger than normal (is bloated).  You have very bad belly pain.  You feel dizzy or you faint. This information is not intended to replace advice given to you by your health care provider. Make sure you discuss any questions you have with your health care provider. Document  Released: 02/27/2008 Document Revised: 03/30/2016 Document Reviewed: 02/29/2016 Elsevier Interactive Patient Education  2017 [place].  High-Fiber Diet Fiber, also called dietary fiber, is a type of carbohydrate found in fruits, vegetables, whole grains, and beans. A high-fiber diet can have many health benefits. Your health care provider may recommend a high-fiber diet to help:  Prevent constipation. Fiber can make your bowel movements more regular.  Lower your cholesterol.  Relieve hemorrhoids, uncomplicated diverticulosis, or irritable bowel syndrome.  Prevent overeating as part of a weight-loss plan.  Prevent heart disease, type 2 diabetes, and certain cancers.  What is my plan? The recommended daily intake of fiber includes:  38 grams for men under age 30.  32 grams for men over age 62.  48 grams for women under age 70.  50 grams for women over age 70.  You can get the recommended daily intake of dietary fiber by eating a variety of fruits, vegetables, grains, and beans. Your health care provider may also recommend a fiber supplement if it is not possible to get enough fiber through your diet. What do I need to know about a high-fiber diet?  Fiber supplements have not been widely studied for their effectiveness, so it is better to get fiber through food sources.  Always check the fiber content on thenutrition facts label of any prepackaged food. Look for foods that contain at least 5 grams of fiber per serving.  Ask your dietitian if you have questions about specific foods that are related to your condition, especially if those foods are not listed in the following section.  Increase your daily fiber consumption gradually. Increasing your intake of dietary fiber too quickly may cause bloating, cramping, or gas.  Drink plenty of water. Water helps you to digest fiber. What foods can I eat? Grains Whole-grain breads. Multigrain cereal. Oats and oatmeal. Brown rice.  Barley. Bulgur wheat. [place]. Bran muffins. Popcorn. Rye wafer crackers. Vegetables Sweet potatoes. Spinach. Kale. Artichokes. Cabbage. Broccoli. Green peas. Carrots. Squash. Fruits Berries. Pears. Apples. Oranges. Avocados. Prunes and raisins. Dried figs. Meats and Other Protein Sources [place], kidney, pinto, and soy beans. Split peas. Lentils. Nuts and seeds. Dairy Fiber-fortified yogurt. Beverages Fiber-fortified soy milk. Fiber-fortified orange juice. Other Fiber bars. The items listed above may not be a complete list of recommended foods or beverages. Contact your dietitian for more options. What foods are not recommended? Grains White bread. Pasta made with refined flour. White rice. Vegetables Fried potatoes. Canned vegetables. Well-cooked vegetables. Fruits Fruit juice. Cooked, strained fruit. Meats and Other Protein Sources Fatty cuts of meat. Fried Sales executive or fried fish. Dairy Milk. Yogurt. Cream cheese. Sour cream. Beverages Soft drinks. Other Cakes and pastries. Butter and oils. The items listed above may not be a complete list of foods and beverages to avoid. Contact your dietitian for more information. What are some tips for including high-fiber foods in my diet?  Eat a wide variety of high-fiber foods.  Make sure that half of all grains consumed each day are whole grains.  Replace breads and cereals made from refined flour or white flour with whole-grain breads and cereals.  Replace white rice with brown rice, bulgur wheat, or millet.  Start the day with a breakfast that is high in fiber, such as a cereal that contains at least 5 grams of fiber per serving.  Use beans in place of meat in soups, salads, or pasta.  Eat high-fiber snacks, such as berries, raw vegetables, nuts, or popcorn. This information is not intended to replace advice given to you by your health care provider. Make sure you discuss any questions you have with your health care  provider. Document Released: 09/10/2005 Document Revised: 02/16/2016 Document Reviewed: 02/23/2014 Elsevier Interactive Patient Education  2017 [employer].

## 2017-08-29 NOTE — Progress Notes (Signed)
Name: Kristin Allison   MRN: [id number]    DOB: August 09, 1986   Date:08/29/2017       Progress Note  Subjective  Chief Complaint  Chief Complaint  Patient presents with  . Constipation    Pt states she has tried natural remedies but nothing has helped, last bm some time last week     HPI  PT presents with concern for constipation.  Tried blueberries, spinach - had small loose BM Sunday 08/25/2017; also took "[employer]" from [facility] - it is a cleanse - had a small loose BM today 08/29/2017.  Last normal BM was early last week.  Drinking plenty of water, still has appetite.  Having abdominal cramping.  Denies nausea or vomiting, blood in stool, dark and tarry stools, no fevers/chills, fatigue/malaise, body aches, lightheadedness, headache. She does note that she was doing the Keto diet for a couple of weeks and was eating more high fat/protein and less fruits and vegetables, thinks this probably caused this episode of constipation.  No family or personal history of colorectal cancer; pt does have history of one episode of constipation in the past many years ago, but usually has regular BM's daily. Never had colonoscopy.  Patient Active Problem List   Diagnosis Date Noted  . Vitamin D deficiency 07/05/2017  . Vitiligo 07/05/2017  . Fibrocystic breast 07/05/2017  . Overweight (BMI 25.0-29.9) 06/16/2015  . Hyperglycemia 06/16/2015    Social History   Tobacco Use  . Smoking status: Never Smoker  . Smokeless tobacco: Never Used  Substance Use Topics  . Alcohol use: Yes    Alcohol/week: 0.6 oz    Types: 1 Glasses of wine per week     Current Outpatient Medications:  .  glycerin adult 2 g suppository, Place 1 suppository rectally as needed for constipation., Disp: 12 suppository, Rfl: 0 .  metFORMIN (GLUCOPHAGE XR) 750 MG 24 hr tablet, Take 1 tablet (750 mg total) by mouth daily with breakfast. (Patient not taking: Reported on 08/29/2017), Disp: 30 tablet, Rfl: 2 .  polyethylene glycol  powder (MIRALAX) powder, Take 17 g by mouth 2 (two) times daily. Once stools are regular, decrease to one time daily., Disp: 500 g, Rfl: 2  No Known Allergies  ROS  Ten systems reviewed and is negative except as mentioned in HPI   Objective  Vitals:   08/29/17 1049  BP: 122/84  Temp: 98.3 F (36.8 C)  TempSrc: Oral  SpO2: 98%  Weight: 160 lb 9.6 oz (72.8 kg)  Height: 5' 4.5" (1.638 m)   Body mass index is 27.14 kg/m.  Nursing Note and Vital Signs reviewed.  Physical Exam  Constitutional: Patient appears well-developed and well-nourished.  No distress.  HEENT: head atraumatic, normocephalic Cardiovascular: Normal rate, regular rhythm, S1/S2 present.  No murmur or rub heard. No BLE edema. Pulmonary/Chest: Effort normal and breath sounds clear. No respiratory distress or retractions. Abdominal: Soft and mild tenderness to LLQ, bowel sounds present and hypoactive x4 quadrants. Rectal Examination: Small non-thrombosed, tender external hemorrhoid. No palpable stool burden internally, no palpable internal hemorrhoids or masses. POCT Hemoccult is negative. Psychiatric: Patient has a normal mood and affect. behavior is normal. Judgment and thought content normal.  Recent Results (from the past 2160 hour(s))  Pap IG and HPV (high risk) DNA detection     Status: None   Collection Time: 07/05/17  3:21 PM  Result Value Ref Range   Clinical Information:      Comment: Other high risk factor, specify NA  LMP:      Comment: N/A   PREV. PAP:      Comment: N/A   PREV. BX:      Comment: N/A   HPV DNA Probe-Source      Comment: None given   STATEMENT OF ADEQUACY:      Comment: Satisfactory for evaluation. Endocervical/transformation zone component present. Age and/or menstrual status not provided    INTERPRETATION/RESULT:      Comment: Negative for intraepithelial lesion or malignancy.   Comment:      Comment: This Pap test has been evaluated with computer assisted  technology.    CYTOTECHNOLOGIST:      Comment: JLT, CT(ASCP) CT screening location: [address], [place], [place] [address]    HPV DNA High Risk Not Detected Not Detect    Comment: This test was performed using the APTIMA HPV Assay ([employer] . This assay detects E6/E7 viral messenger RNA (mRNA) from 14 high-risk HPV types (16,18,31,33,35,39,45,51,52,56,58,59,66,68). . The analytical performance characteristics of this assay have been determined by [facility]. The modifications have not been cleared or approved by the FDA. This assay has been validated pursuant to the CLIA regulations and is used for clinical purposes. EXPLANATORY NOTE:  . The Pap is a screening test for cervical cancer. It is  not a diagnostic test and is subject to false negative  and false positive results. It is most reliable when a  satisfactory sample, regularly obtained, is submitted  with relevant clinical findings and history, and when  the Pap result is evaluated along with historic and  current clinical information. Marland Kitchen   HIV antibody     Status: None   Collection Time: 07/26/17 12:08 PM  Result Value Ref Range   HIV 1&2 Ab, 4th Generation NON-REACTIVE NON-REACTI    Comment: HIV-1 antigen and HIV-1/HIV-2 antibodies were not detected. There is no laboratory evidence of HIV infection. Marland Kitchen PLEASE NOTE: This information has been disclosed to you from records whose confidentiality may be protected by state law.  If your state requires such protection, then the state law prohibits you from making any further disclosure of the information without the specific written consent of the person to whom it pertains, or as otherwise permitted by law. A general authorization for the release of medical or other information is NOT sufficient for this purpose. . For additional information please refer to http://education.[facility].com/faq/FAQ106 (This link is being provided for  informational/ educational purposes only.) . Marland Kitchen The performance of this assay has not been clinically validated in patients less than 46 years old. .   COMPLETE METABOLIC PANEL WITH GFR     Status: None   Collection Time: 07/26/17 12:08 PM  Result Value Ref Range   Glucose, Bld 83 65 - 99 mg/dL    Comment: .            Fasting reference interval .    BUN 7 7 - 25 mg/dL   Creat 0.74 0.50 - 1.10 mg/dL   GFR, Est Non African American 107 > OR = 60 mL/min/1.27m2   GFR, Est African American 124 > OR = 60 mL/min/1.1m2   BUN/Creatinine Ratio NOT APPLICABLE 6 - 22 (calc)   Sodium 139 135 - 146 mmol/L   Potassium 4.1 3.5 - 5.3 mmol/L   Chloride 106 98 - 110 mmol/L   CO2 24 20 - 32 mmol/L   Calcium 9.2 8.6 - 10.2 mg/dL   Total Protein 6.9 6.1 - 8.1 g/dL   Albumin  4.4 3.6 - 5.1 g/dL   Globulin 2.5 1.9 - 3.7 g/dL (calc)   AG Ratio 1.8 1.0 - 2.5 (calc)   Total Bilirubin 0.7 0.2 - 1.2 mg/dL   Alkaline phosphatase (APISO) 58 33 - 115 U/L   AST 18 10 - 30 U/L   ALT 16 6 - 29 U/L  Lipid panel     Status: Abnormal   Collection Time: 07/26/17 12:08 PM  Result Value Ref Range   Cholesterol 189 <200 mg/dL   HDL 68 >50 mg/dL   Triglycerides 45 <150 mg/dL   LDL Cholesterol (Calc) 108 (H) mg/dL (calc)    Comment: Reference range: <100 . Desirable range <100 mg/dL for primary prevention;   <70 mg/dL for patients with CHD or diabetic patients  with > or = 2 CHD risk factors. Marland Kitchen LDL-C is now calculated using the Martin-Hopkins  calculation, which is a validated novel method providing  better accuracy than the Friedewald equation in the  estimation of LDL-C.  Cresenciano Genre et al. Annamaria Helling. 8144;818(56): 2061-2068  (http://education.[facility].com/faq/FAQ164)    Total CHOL/HDL Ratio 2.8 <5.0 (calc)   Non-HDL Cholesterol (Calc) 121 <130 mg/dL (calc)    Comment: For patients with diabetes plus 1 major ASCVD risk  factor, treating to a non-HDL-C goal of <100 mg/dL  (LDL-C of <70 mg/dL) is  considered a therapeutic  option.   Hemoglobin A1c     Status: None   Collection Time: 07/26/17 12:08 PM  Result Value Ref Range   Hgb A1c MFr Bld 5.2 <5.7 % of total Hgb    Comment: For the purpose of screening for the presence of diabetes: . <5.7%       Consistent with the absence of diabetes 5.7-6.4%    Consistent with increased risk for diabetes             (prediabetes) > or =6.5%  Consistent with diabetes . This assay result is consistent with a decreased risk of diabetes. . Currently, no consensus exists regarding use of hemoglobin A1c for diagnosis of diabetes in children. . According to American Diabetes Association (ADA) guidelines, hemoglobin A1c <7.0% represents optimal control in non-pregnant diabetic patients. Different metrics may apply to specific patient populations.  Standards of Medical Care in Diabetes(ADA). .    Mean Plasma Glucose 103 (calc)   eAG (mmol/L) 5.7 (calc)  CBC     Status: None   Collection Time: 07/26/17 12:08 PM  Result Value Ref Range   WBC 6.4 3.8 - 10.8 Thousand/uL   RBC 4.55 3.80 - 5.10 Million/uL   Hemoglobin 13.9 11.7 - 15.5 g/dL   HCT 41.0 35.0 - 45.0 %   MCV 90.1 80.0 - 100.0 fL   MCH 30.5 27.0 - 33.0 pg   MCHC 33.9 32.0 - 36.0 g/dL   RDW 12.9 11.0 - 15.0 %   Platelets 372 140 - 400 Thousand/uL   MPV 9.9 7.5 - 12.5 fL  VITAMIN D 25 Hydroxy (Vit-D Deficiency, Fractures)     Status: Abnormal   Collection Time: 07/26/17 12:08 PM  Result Value Ref Range   Vit D, 25-Hydroxy 24 (L) 30 - 100 ng/mL    Comment: Vitamin D Status         25-OH Vitamin D: . Deficiency:                    <20 ng/mL Insufficiency:             20 - 29 ng/mL Optimal:                 >  or = 30 ng/mL . For 25-OH Vitamin D testing on patients on  D2-supplementation and patients for whom quantitation  of D2 and D3 fractions is required, the QuestAssureD(TM) 25-OH VIT D, (D2,D3), LC/MS/MS is recommended: order  code [id number] (patients >24yrs). . For more  information on this test, go to: http://education.quest[facility].com/faq/FAQ163 (This link is being provided for  informational/educational purposes only.)   Insulin, random     Status: None   Collection Time: 07/26/17 12:11 PM  Result Value Ref Range   Insulin 4.5 2.0 - 19.6 uIU/mL    Comment: This insulin assay shows strong cross-reactivity for some insulin analogs (lispro, aspart, and glargine) and much lower cross-reactivity with others (detemir, glulisine).   POCT Occult Blood Stool     Status: Normal   Collection Time: 08/29/17 11:32 AM  Result Value Ref Range   Fecal Occult Blood, POC Negative Negative     Assessment & Plan  1. Constipation, unspecified constipation type - polyethylene glycol powder (MIRALAX) powder; Take 17 g by mouth 2 (two) times daily. Once stools are regular, decrease to one time daily.  Dispense: 500 g; Refill: 2 - glycerin adult 2 g suppository; Place 1 suppository rectally as needed for constipation.  Dispense: 25 suppository; Refill: 0 - POCT Occult Blood Stool - Negative - See AVS - high fiber diet, increase fluid intake, continue exercise as tolerated. - Return if symptoms worsen or fail to improve, for 2-5 days. -Red flags and when to present for emergency care or RTC including fever >101.83F, abdominal distension/bloating, abdominal pain, vomiting, cessation of flatus or BM's, blood in stool or dark/tarry stools, new/worsening/un-resolving symptoms, reviewed with patient at time of visit. Follow up and care instructions discussed and provided in AVS.

## 2017-09-03 ENCOUNTER — Telehealth: Payer: Self-pay

## 2017-09-03 NOTE — Telephone Encounter (Signed)
LVM to inform pt her records that she requested to go to [employer] were completed on 08/28/17 by [employer]

## 2017-09-03 NOTE — Telephone Encounter (Signed)
Copied from [place] [id number]. Topic: Medical Record Request - Other >> Aug 30, 2017 12:57 PM Valla Leaver wrote: Patient Name/DOB/MRN #: Kristin Allison,Kristin Allison/Nov 17, 1985/[id number] Requestor Name/Agency: [employer] Call Back #: [phone] Information Requested: Patient signed release and was told it would be sent to designated fax by 12/03. Checking on the status.   Route to [employer] for [employer]. For all other clinics, route to the clinic's PEC Pool.  >> Sep 02, 2017 12:57 PM Scherrie Gerlach wrote: Pt following up on this request for labs to be released [employer] HIV and PAP results >> Sep 03, 2017 10:23 AM Oliver Pila B wrote: Pt called and would like to be contacted on the status on her medical records request

## 2017-09-06 DIAGNOSIS — Z124 Encounter for screening for malignant neoplasm of cervix: Secondary | ICD-10-CM | POA: Diagnosis not present

## 2017-09-06 DIAGNOSIS — Z01419 Encounter for gynecological examination (general) (routine) without abnormal findings: Secondary | ICD-10-CM | POA: Diagnosis not present

## 2017-10-04 ENCOUNTER — Ambulatory Visit: Payer: BLUE CROSS/BLUE SHIELD | Admitting: Family Medicine
